# Patient Record
Sex: Male | Born: 1990 | Race: Black or African American | Hispanic: No
Health system: Southern US, Community
[De-identification: ages and names within clinical notes are randomized; demographics above are authoritative.]

---

## 2017-09-19 ENCOUNTER — Encounter (HOSPITAL_COMMUNITY): Payer: Self-pay | Admitting: Emergency Medicine

## 2017-09-19 ENCOUNTER — Other Ambulatory Visit: Payer: Self-pay

## 2017-09-19 DIAGNOSIS — R05 Cough: Secondary | ICD-10-CM | POA: Insufficient documentation

## 2017-09-19 DIAGNOSIS — J029 Acute pharyngitis, unspecified: Secondary | ICD-10-CM | POA: Insufficient documentation

## 2017-09-19 DIAGNOSIS — R509 Fever, unspecified: Secondary | ICD-10-CM | POA: Insufficient documentation

## 2017-09-19 DIAGNOSIS — R0981 Nasal congestion: Secondary | ICD-10-CM | POA: Insufficient documentation

## 2017-09-19 DIAGNOSIS — R51 Headache: Secondary | ICD-10-CM | POA: Insufficient documentation

## 2017-09-19 DIAGNOSIS — M7918 Myalgia, other site: Secondary | ICD-10-CM | POA: Insufficient documentation

## 2017-09-19 NOTE — ED Triage Notes (Signed)
Pt complaint cough, headache, generalized aches, and fever onset 3 days ago.

## 2017-09-20 ENCOUNTER — Emergency Department (HOSPITAL_COMMUNITY)
Admission: EM | Admit: 2017-09-20 | Discharge: 2017-09-20 | Disposition: A | Discharge status: Home / Self Care | Payer: Self-pay | Attending: Emergency Medicine | Admitting: Emergency Medicine

## 2017-09-20 ENCOUNTER — Emergency Department (HOSPITAL_COMMUNITY): Payer: Self-pay

## 2017-09-20 DIAGNOSIS — R6889 Other general symptoms and signs: Secondary | ICD-10-CM

## 2017-09-20 LAB — RESPIRATORY PANEL BY PCR
Adenovirus: NOT DETECTED
BORDETELLA PERTUSSIS-RVPCR: NOT DETECTED
CHLAMYDOPHILA PNEUMONIAE-RVPPCR: NOT DETECTED
Coronavirus 229E: NOT DETECTED
Coronavirus HKU1: NOT DETECTED
Coronavirus NL63: NOT DETECTED
Coronavirus OC43: NOT DETECTED
INFLUENZA A-RVPPCR: NOT DETECTED
INFLUENZA B-RVPPCR: NOT DETECTED
Metapneumovirus: NOT DETECTED
Mycoplasma pneumoniae: NOT DETECTED
PARAINFLUENZA VIRUS 3-RVPPCR: NOT DETECTED
PARAINFLUENZA VIRUS 4-RVPPCR: NOT DETECTED
Parainfluenza Virus 1: NOT DETECTED
Parainfluenza Virus 2: NOT DETECTED
Respiratory Syncytial Virus: NOT DETECTED
Rhinovirus / Enterovirus: NOT DETECTED

## 2017-09-20 MED ORDER — IBUPROFEN 800 MG PO TABS
800.0000 mg | ORAL_TABLET | Freq: Once | ORAL | Status: AC
  Administered 2017-09-20: 800 mg via ORAL
  Filled 2017-09-20: qty 1

## 2017-09-20 MED ORDER — ACETAMINOPHEN 500 MG PO TABS: 1000.0000 mg | ORAL_TABLET | Freq: Once | ORAL | Status: DC

## 2017-09-20 NOTE — ED Notes (Signed)
Bed: WA03 Expected date:  Expected time:  Means of arrival:  Comments: 

## 2017-09-20 NOTE — ED Provider Notes (Signed)
[facility]-EMERGENCY DEPT Provider Note   CSN: [id number] Arrival date & time: 09/19/17  1821     History   Chief Complaint Chief Complaint  Patient presents with  . Flu Like Symptoms    HPI Daniel Oconnor is a 27 y.o. male.  Who presents the emergency department for flulike symptoms.  Patient states he had onset of fever, chills, body aches, cough, sore throat, nasal congestion and a mild headache starting 2 days ago.  He has not taken anything at home for his symptoms.  He has a sensation of ear pressure but denies pain.  He denies neck stiffness, rashes, contacts with similar symptoms.  HPI  History reviewed. No pertinent past medical history.  There are no active problems to display for this patient.   History reviewed. No pertinent surgical history.     Home Medications    Prior to Admission medications   Not on File    Family History No family history on file.  Social History Social History   Tobacco Use  . Smoking status: Not on file  Substance Use Topics  . Alcohol use: Not on file  . Drug use: Not on file     Allergies   Patient has no known allergies.   Review of Systems Review of Systems Ten systems reviewed and are negative for acute change, except as noted in the HPI.    Physical Exam Updated Vital Signs BP 125/83 (BP Location: Right Arm)   Pulse 91   Temp (!) 102.1 F (38.9 C)   Resp 18   SpO2 97%   Physical Exam  Constitutional: He is oriented to person, place, and time. He appears well-developed and well-nourished. No distress.  HENT:  Head: Normocephalic and atraumatic.  Eyes: Conjunctivae are normal. No scleral icterus.  Neck: Normal range of motion. Neck supple.  Cardiovascular: Normal rate, regular rhythm and normal heart sounds.  Pulmonary/Chest: Effort normal and breath sounds normal. No respiratory distress.  Abdominal: Soft. There is no tenderness.  Musculoskeletal: He exhibits no edema.    Neurological: He is alert and oriented to person, place, and time.  Skin: Skin is warm and dry. He is not diaphoretic.  Psychiatric: His behavior is normal.  Nursing note and vitals reviewed.    ED Treatments / Results  Labs (all labs ordered are listed, but only abnormal results are displayed) Labs Reviewed  RESPIRATORY PANEL BY PCR    EKG  EKG Interpretation None       Radiology Dg Chest 2 View  Result Date: 09/20/2017 CLINICAL DATA:  Fever, body aches for 3 days. EXAM: CHEST  2 VIEW COMPARISON:  CT chest February 09, 2016 FINDINGS: Cardiomediastinal silhouette is normal. No pleural effusions or focal consolidations. Trachea projects midline and there is no pneumothorax. Soft tissue planes and included osseous structures are non-suspicious. IMPRESSION: Normal chest. Electronically Signed   By: Awilda Metroourtnay  Bloomer M.D.   On: 09/20/2017 03:41    Procedures Procedures (including critical care time)  Medications Ordered in ED Medications  ibuprofen (ADVIL,MOTRIN) tablet 800 mg (800 mg Oral Given 09/20/17 0228)     Initial Impression / Assessment and Plan / ED Course  I have reviewed the triage vital signs and the nursing notes.  Pertinent labs & imaging results that were available during my care of the patient were reviewed by me and considered in my medical decision making (see chart for details).     Patient with symptoms consistent with influenza.  Vitals are  stable, low-grade fever.  No signs of dehydration, tolerating PO's.  Lungs are clear. Due to patient's presentation and physical exam a chest x-ray was not ordered bc likely diagnosis of flu.  Discussed the cost versus benefit of Tamiflu treatment with the patient.  The patient understands that symptoms are greater than the recommended 24-48 hour window of treatment.  Patient will be discharged with instructions to orally hydrate, rest, and use over-the-counter medications such as anti-inflammatories ibuprofen and Aleve  for muscle aches and Tylenol for fever.  Tries to take over-the-counter medication  Final Clinical Impressions(s) / ED Diagnoses   Final diagnoses:  Flu-like symptoms    ED Discharge Orders    None       Arthor Captain, PA-C 09/20/17 0409    Paula Libra, MD 09/20/17 [phone]

## 2017-09-20 NOTE — Discharge Instructions (Addendum)

## 2018-12-31 IMAGING — CR DG CHEST 2V
2 series · 2 of 2 positions shown · non-contrast
Comparison: CT chest February 09, 2016

CLINICAL DATA: Fever, body aches for 3 days.

EXAM:
CHEST  2 VIEW

[w chest pa]
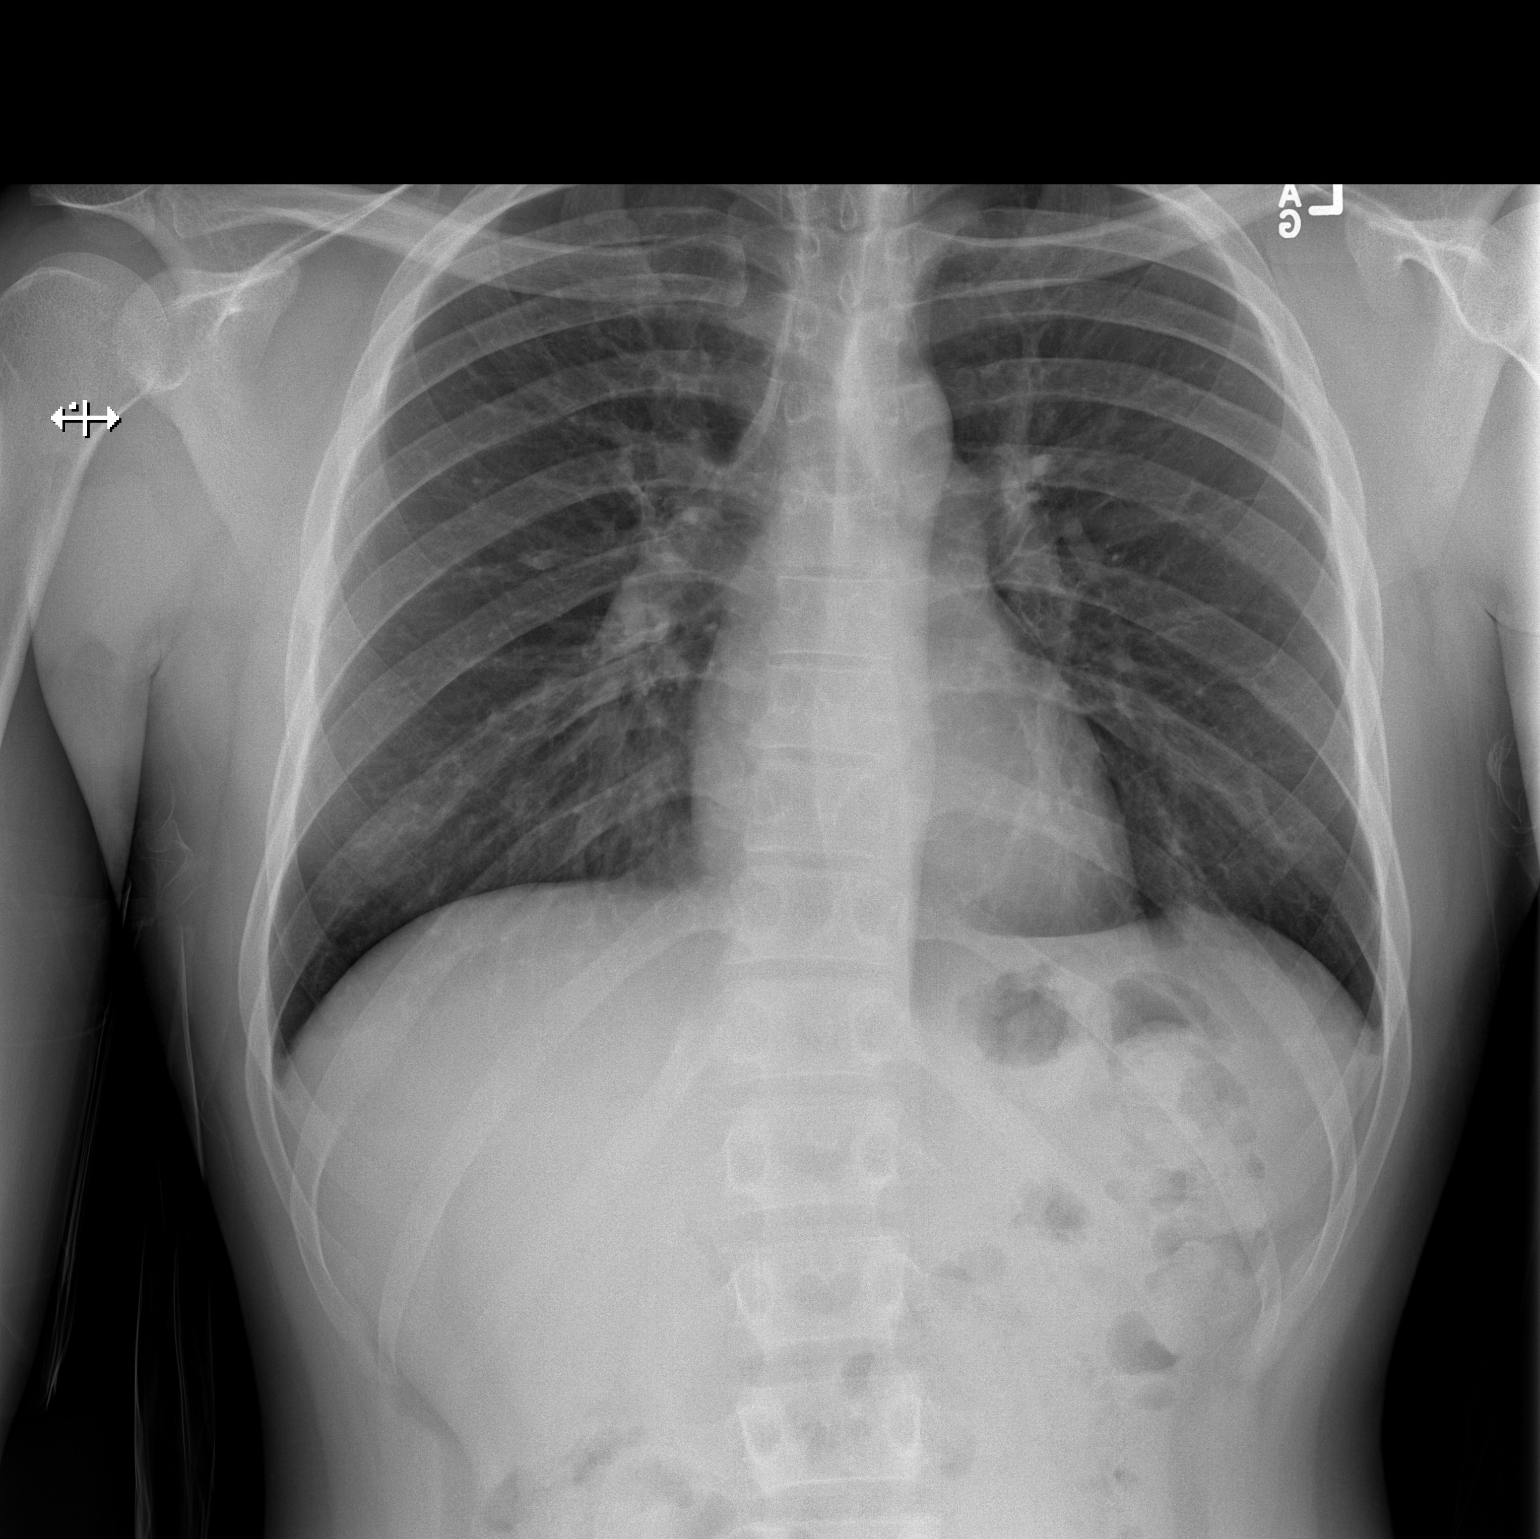

[w chest lat]
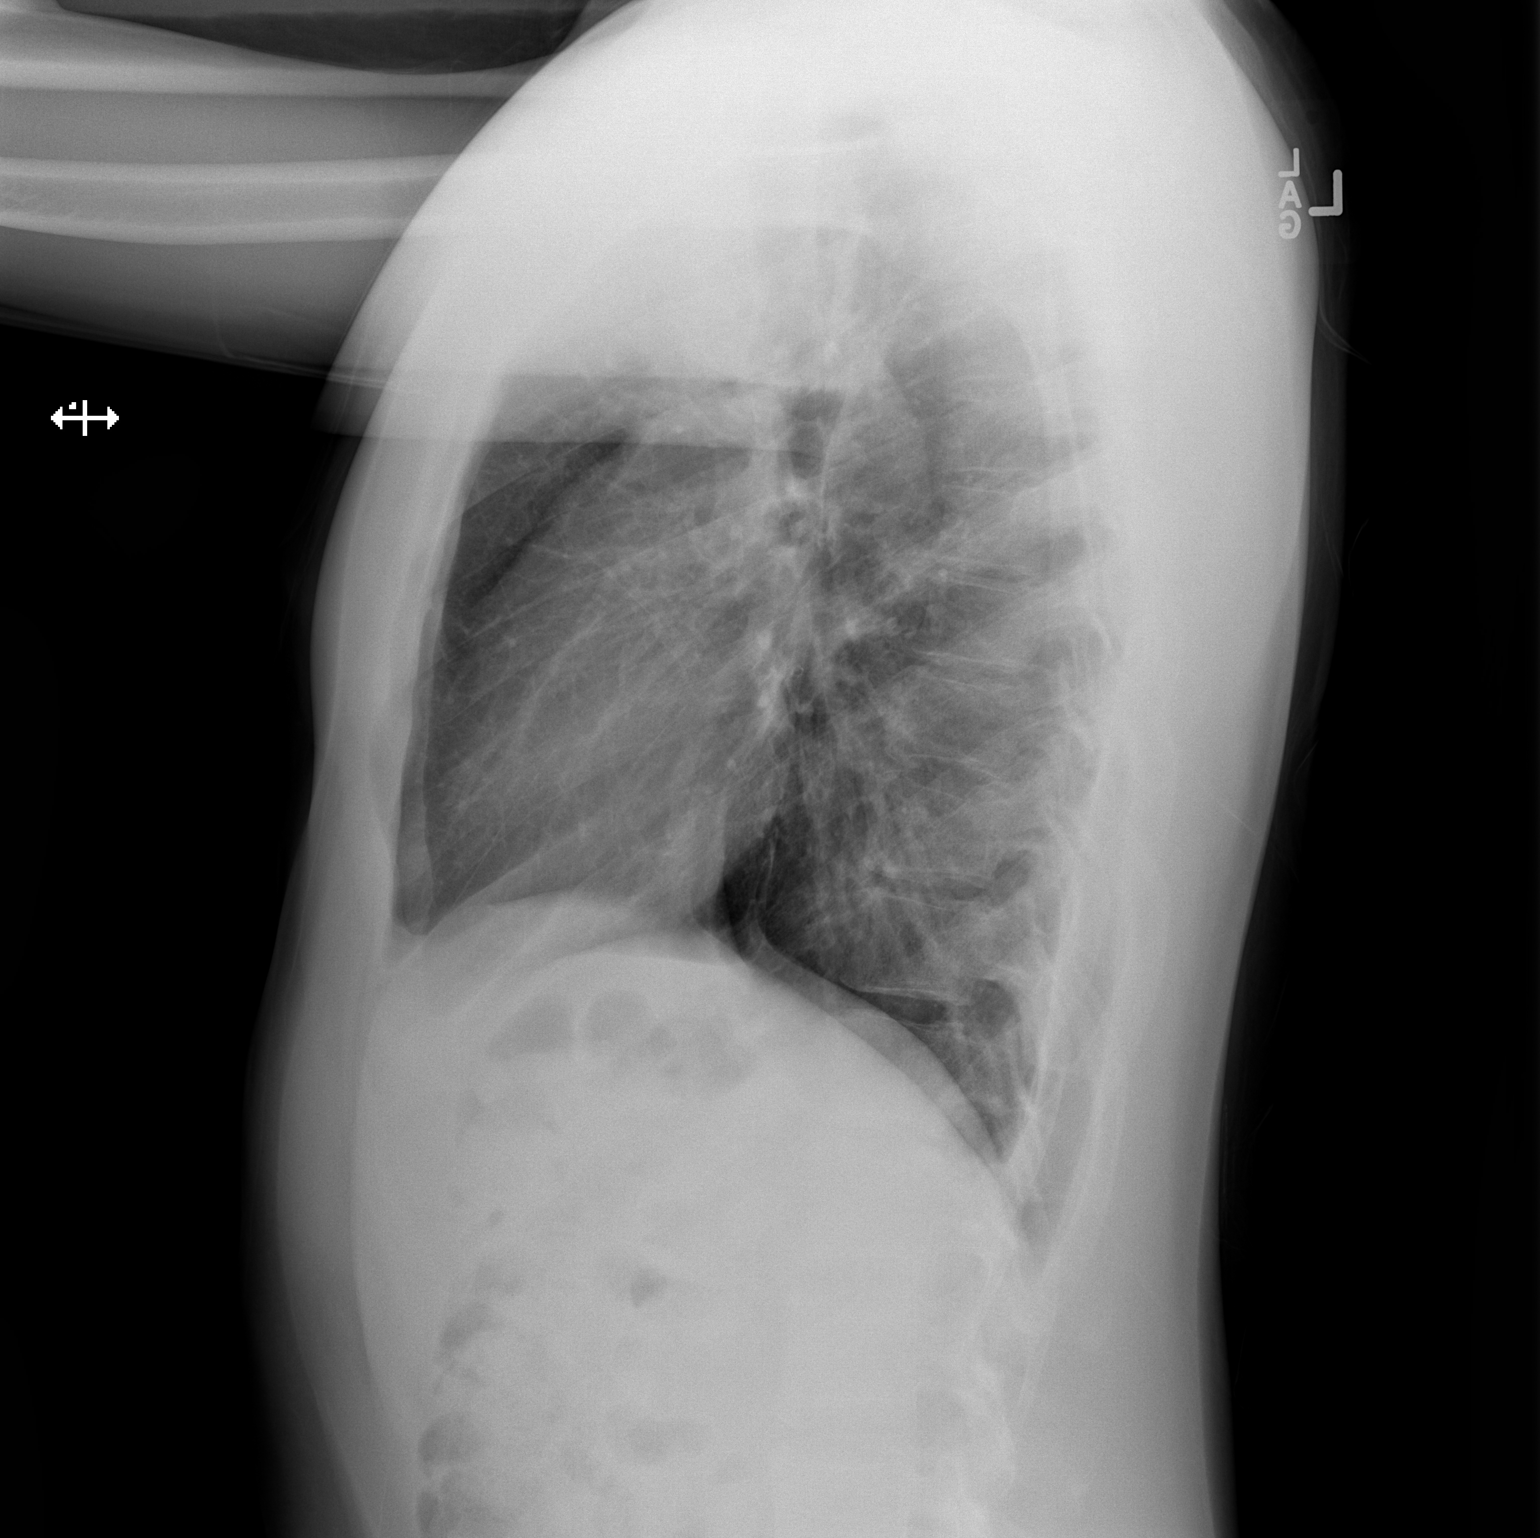

[2 of 2 positions shown; findings below may reference images not displayed]

FINDINGS: Cardiomediastinal silhouette is normal. No pleural effusions or
focal consolidations. Trachea projects midline and there is no
pneumothorax. Soft tissue planes and included osseous structures are
non-suspicious.
IMPRESSION: Normal chest.
